# Patient Record
Sex: Male | Born: 1975 | Race: Black or African American | Hispanic: No | Marital: Single | Smoking: Former smoker
Health system: Southern US, Community
[De-identification: ages and names within clinical notes are randomized; demographics above are authoritative.]

## PROBLEM LIST (undated history)

## (undated) DIAGNOSIS — I10 Essential (primary) hypertension: Secondary | ICD-10-CM

## (undated) HISTORY — DX: Essential (primary) hypertension: I10

---

## 2016-01-16 ENCOUNTER — Emergency Department
Admission: EM | Admit: 2016-01-16 | Discharge: 2016-01-16 | Disposition: A | Discharge status: Home / Self Care | Payer: Self-pay | Attending: Emergency Medicine | Admitting: Emergency Medicine

## 2016-01-16 ENCOUNTER — Emergency Department: Payer: Self-pay

## 2016-01-16 ENCOUNTER — Encounter: Payer: Self-pay | Admitting: Emergency Medicine

## 2016-01-16 DIAGNOSIS — M25561 Pain in right knee: Secondary | ICD-10-CM

## 2016-01-16 DIAGNOSIS — M25461 Effusion, right knee: Secondary | ICD-10-CM | POA: Insufficient documentation

## 2016-01-16 DIAGNOSIS — Z87891 Personal history of nicotine dependence: Secondary | ICD-10-CM | POA: Insufficient documentation

## 2016-01-16 LAB — SYNOVIAL CELL COUNT + DIFF, W/ CRYSTALS
Crystals, Fluid: NONE SEEN
EOSINOPHILS-SYNOVIAL: 0 %
Lymphocytes-Synovial Fld: 13 %
MONOCYTE-MACROPHAGE-SYNOVIAL FLUID: 86 %
Neutrophil, Synovial: 1 %
Other Cells-SYN: 0
WBC, Synovial: 266 /mm3 — ABNORMAL HIGH (ref 0–200)

## 2016-01-16 MED ORDER — TRAMADOL HCL 50 MG PO TABS: 50.0000 mg | ORAL_TABLET | Freq: Four times a day (QID) | ORAL | 0 refills | Status: DC | PRN

## 2016-01-16 MED ORDER — OXYCODONE-ACETAMINOPHEN 5-325 MG PO TABS
1.0000 | ORAL_TABLET | Freq: Once | ORAL | Status: AC
Start: 2016-01-16 — End: 2016-01-16
  Administered 2016-01-16: 1 via ORAL
  Filled 2016-01-16: qty 1

## 2016-01-16 MED ORDER — NAPROXEN 500 MG PO TABS: 500.0000 mg | ORAL_TABLET | Freq: Two times a day (BID) | ORAL | 0 refills | Status: DC

## 2016-01-16 MED ORDER — LIDOCAINE HCL (PF) 1 % IJ SOLN
2.0000 mL | Freq: Once | INTRAMUSCULAR | Status: AC
  Administered 2016-01-16: 2 mL
  Filled 2016-01-16: qty 5

## 2016-01-16 NOTE — ED Provider Notes (Signed)
[facility]-EMERGENCY DEPARTMENT Provider Note   CSN: [id number] Arrival date & time: 01/16/16  1633     History   Chief Complaint Chief Complaint  Patient presents with  . Knee Pain    HPI Robert Vincent is a 40 y.o. male presents to the emergency department for evaluation of right knee pain. Patient states his pain began 1 day ago. He denies any trauma or injury. He describes tightness swelling and warmth throughout the right knee. No history of gout. Patient has difficulty with ambulation due to tightness and pain and pressure throughout the knee. Pain is mostly along the superior patellar region. He denies any calf pain. No fevers or radicular pain. He has not had any recent surgeries, long flights or car rides. No chest pain or shortness of breath. He is not taking any medication for pain pain is currently 10 out of 10.  HPI  History reviewed. No pertinent past medical history.  There are no active problems to display for this patient.   History reviewed. No pertinent surgical history.     Home Medications    Prior to Admission medications   Medication Sig Start Date End Date Taking? Authorizing Provider  naproxen (NAPROSYN) 500 MG tablet Take 1 tablet (500 mg total) by mouth 2 (two) times daily with a meal. 01/16/16   Evon Slack, PA-C  traMADol (ULTRAM) 50 MG tablet Take 1 tablet (50 mg total) by mouth every 6 (six) hours as needed. 01/16/16   Evon Slack, PA-C    Family History No family history on file.  Social History Social History  Substance Use Topics  . Smoking status: Former Games developer  . Smokeless tobacco: Not on file  . Alcohol use Not on file     Allergies   Patient has no known allergies.   Review of Systems Review of Systems  Constitutional: Negative.  Negative for activity change, appetite change, chills and fever.  HENT: Negative for congestion, ear pain, mouth sores, rhinorrhea, sinus pressure, sore throat and trouble swallowing.   Eyes:  Negative for photophobia, pain and discharge.  Respiratory: Negative for cough, chest tightness and shortness of breath.   Cardiovascular: Negative for chest pain and leg swelling.  Gastrointestinal: Negative for abdominal distention, abdominal pain, diarrhea, nausea and vomiting.  Genitourinary: Negative for difficulty urinating and dysuria.  Musculoskeletal: Positive for arthralgias and joint swelling. Negative for back pain and gait problem.  Skin: Negative for color change and rash.  Neurological: Negative for dizziness and headaches.  Hematological: Negative for adenopathy.  Psychiatric/Behavioral: Negative for agitation and behavioral problems.     Physical Exam Updated Vital Signs BP 127/68   Pulse 90   Temp 98.6 F (37 C) (Oral)   Resp 18   Ht 5\' 8"  (1.727 m)   Wt 104.3 kg   SpO2 98%   BMI 34.97 kg/m   Physical Exam  Constitutional: He appears well-developed and well-nourished.  HENT:  Head: Normocephalic and atraumatic.  Eyes: Conjunctivae are normal.  Neck: Neck supple.  Cardiovascular: Normal rate and regular rhythm.   No murmur heard. Pulmonary/Chest: Effort normal and breath sounds normal. No respiratory distress. He has no wheezes. He has no rales.  Musculoskeletal:  Examination of the right lower sternum shows patient has negative logroll test area no pain with right hip internal or external rotation. He has limited flexion of the knee, 0-70. There is a moderate effusion. He is able to straight leg raise. There is swelling and warmth throughout the right  knee. No signs of cellulitis or induration indicating soft tissue infection. Patient is nontender throughout the calf and has no pain with ankle plantarflexion and dorsiflexion.  Neurological: He is alert.  Skin: Skin is warm and dry.  Psychiatric: He has a normal mood and affect.  Nursing note and vitals reviewed.    ED Treatments / Results  Labs (all labs ordered are listed, but only abnormal results  are displayed) Labs Reviewed  SYNOVIAL CELL COUNT + DIFF, W/ CRYSTALS - Abnormal; Notable for the following:       Result Value   Color, Synovial STRAW (*)    Appearance-Synovial HAZY (*)    WBC, Synovial 266 (*)    All other components within normal limits  BODY FLUID CULTURE    EKG  EKG Interpretation None       Radiology Dg Knee Complete 4 Views Right  Result Date: 01/16/2016 CLINICAL DATA:  Right knee pain and swelling.  No reported injury. EXAM: RIGHT KNEE - COMPLETE 4+ VIEW COMPARISON:  None. FINDINGS: No fracture, dislocation, joint effusion or suspicious focal osseous lesion. Small superior right patellar enthesophyte. No significant degenerative or erosive arthropathy. No pathologic soft tissue calcifications. IMPRESSION: 1. No fracture, joint effusion or malalignment. 2. Small superior right patellar enthesophyte. Electronically Signed   By: Delbert PhenixJason A Poff M.D.   On: 01/16/2016 18:14    Procedures Procedures (including critical care time) Right knee aspiration: Right knee was prepped with chlorhexidine and Betadine and the superior lateral patellar position. Knee was injected with lidocaine 1%, 3 cc. Knee was then cleansed again with chlorhexidine and 18-gauge needle with 60 cc syringe was injected into the knee joint and 20 cc of normal synovial fluid was aspirated from the knee. Band-Aid was applied, patient tolerated procedure well.  SPLINT APPLICATION Date/Time: 8:24 PM Authorized by: Patience MuscaGAINES, Kourosh Jablonsky CHRISTOPHER Consent: Verbal consent obtained. Risks and benefits: risks, benefits and alternatives were discussed Consent given by: patient Splint applied by: ED tech Location details: Right knee  Splint type: Ace wrap  Supplies used: Ace wrap and crutches  Post-procedure: The splinted body part was neurovascularly unchanged following the procedure. Patient tolerance: Patient tolerated the procedure well with no immediate complications.     Medications Ordered in  ED Medications  lidocaine (PF) (XYLOCAINE) 1 % injection 2 mL (2 mLs Infiltration Given 01/16/16 1730)  oxyCODONE-acetaminophen (PERCOCET/ROXICET) 5-325 MG per tablet 1 tablet (1 tablet Oral Given 01/16/16 1756)     Initial Impression / Assessment and Plan / ED Course  I have reviewed the triage vital signs and the nursing notes.  Pertinent labs & imaging results that were available during my care of the patient were reviewed by me and considered in my medical decision making (see chart for details).  Clinical Course     40 year old male with right knee effusion. No trauma or injury. Concern for possible gout, aspiration of the knee produced 20 cc of normal synovial fluid, culture and cell count and crystals were all negative for any type of gout, CPPD, septic joint. X-ray showed no significant arthropathy, acute bony abnormality. Ace wrap was applied, patient is given crutches. He will take anti-inflammatory medications and follow-up with orthopedics in 5-7 days if no improvement. Return to the ER for any worsening symptoms urgent changes in his health.  Final Clinical Impressions(s) / ED Diagnoses   Final diagnoses:  Acute pain of right knee  Effusion of right knee    New Prescriptions New Prescriptions   NAPROXEN (NAPROSYN) 500 MG  TABLET    Take 1 tablet (500 mg total) by mouth 2 (two) times daily with a meal.   TRAMADOL (ULTRAM) 50 MG TABLET    Take 1 tablet (50 mg total) by mouth every 6 (six) hours as needed.     Jeanna Giuffre C GainEvon Slackes, PA-C 01/16/16 2025    Emily FilbertJonathan E Williams, MD 01/16/16 2114

## 2016-01-16 NOTE — ED Triage Notes (Signed)
R knee pain began yesterday. Denies injury. Limp noted.

## 2016-01-16 NOTE — Discharge Instructions (Signed)
History medications as prescribed. Rest ice and elevate the knee. Use crutches as needed for ambulation. Follow-up with orthopedics if no improvement 5-7 days. Return to the ER for any increasing pain, swelling, warmth redness or fevers.

## 2016-01-20 LAB — BODY FLUID CULTURE
Culture: NO GROWTH
SPECIAL REQUESTS: NORMAL

## 2018-05-16 ENCOUNTER — Other Ambulatory Visit: Payer: Self-pay

## 2018-05-16 ENCOUNTER — Emergency Department: Payer: Self-pay

## 2018-05-16 ENCOUNTER — Emergency Department
Admission: EM | Admit: 2018-05-16 | Discharge: 2018-05-16 | Disposition: A | Discharge status: Home / Self Care | Payer: Self-pay | Attending: Emergency Medicine | Admitting: Emergency Medicine

## 2018-05-16 DIAGNOSIS — Z87891 Personal history of nicotine dependence: Secondary | ICD-10-CM | POA: Insufficient documentation

## 2018-05-16 DIAGNOSIS — J189 Pneumonia, unspecified organism: Secondary | ICD-10-CM | POA: Insufficient documentation

## 2018-05-16 MED ORDER — ACETAMINOPHEN 500 MG PO TABS
1000.0000 mg | ORAL_TABLET | Freq: Once | ORAL | Status: AC
Start: 2018-05-16 — End: 2018-05-16
  Administered 2018-05-16: 1000 mg via ORAL
  Filled 2018-05-16: qty 2

## 2018-05-16 MED ORDER — AZITHROMYCIN 500 MG PO TABS
500.0000 mg | ORAL_TABLET | Freq: Once | ORAL | Status: AC
  Administered 2018-05-16: 500 mg via ORAL
  Filled 2018-05-16: qty 1

## 2018-05-16 MED ORDER — AZITHROMYCIN 250 MG PO TABS: ORAL_TABLET | ORAL | 0 refills | Status: AC

## 2018-05-16 NOTE — ED Triage Notes (Signed)
Pt states fever of 104 since this am with vomiting and headache. Pt denies sore throat, nasal congestion. Pt denies cough or shob. Pt states has taken no antipyretics.

## 2018-05-16 NOTE — Discharge Instructions (Signed)
Take the antibiotic as prescribed starting tomorrow (the first dose was given in the ER).  You can take Tylenol 500 mg up to every 4 hours, or ibuprofen 600 mg up to every 6 hours for fever or pain.  You can also alternate the 2.  Your chest x-ray is showing a pneumonia in the left lung.  This is most likely due to bacteria.  Since at this time we cannot fully rule out coronavirus, you must take the precautions below.  Return to the ER for new or worsening shortness of breath, worsening headache, persistent high fevers, vomiting, weakness, or any other new or worsening symptoms that concern you.     Person Under Monitoring Name: Robert FrameMichael Vincent  Location: [address] [place] [id number]   Infection Prevention Recommendations for Individuals Confirmed to have, or Being Evaluated for, 2019 Novel Coronavirus (COVID-19) Infection Who Receive Care at Home  Individuals who are confirmed to have, or are being evaluated for, COVID-19 should follow the prevention steps below until a healthcare provider or local or state health department says they can return to normal activities.  Stay home except to get medical care You should restrict activities outside your home, except for getting medical care. Do not go to work, school, or public areas, and do not use public transportation or taxis.  Call ahead before visiting your doctor Before your medical appointment, call the healthcare provider and tell them that you have, or are being evaluated for, COVID-19 infection. This will help the healthcare providers office take steps to keep other people from getting infected. Ask your healthcare provider to call the local or state health department.  Monitor your symptoms Seek prompt medical attention if your illness is worsening (e.g., difficulty breathing). Before going to your medical appointment, call the healthcare provider and tell them that you have, or are being evaluated for, COVID-19  infection. Ask your healthcare provider to call the local or state health department.  Wear a facemask You should wear a facemask that covers your nose and mouth when you are in the same room with other people and when you visit a healthcare provider. People who live with or visit you should also wear a facemask while they are in the same room with you.  Separate yourself from other people in your home As much as possible, you should stay in a different room from other people in your home. Also, you should use a separate bathroom, if available.  Avoid sharing household items You should not share dishes, drinking glasses, cups, eating utensils, towels, bedding, or other items with other people in your home. After using these items, you should wash them thoroughly with soap and water.  Cover your coughs and sneezes Cover your mouth and nose with a tissue when you cough or sneeze, or you can cough or sneeze into your sleeve. Throw used tissues in a lined trash can, and immediately wash your hands with soap and water for at least 20 seconds or use an alcohol-based hand rub.  Wash your Union Pacific Corporationhands Wash your hands often and thoroughly with soap and water for at least 20 seconds. You can use an alcohol-based hand sanitizer if soap and water are not available and if your hands are not visibly dirty. Avoid touching your eyes, nose, and mouth with unwashed hands.   Prevention Steps for Caregivers and Household Members of Individuals Confirmed to have, or Being Evaluated for, COVID-19 Infection Being Cared for in the Home  If you live  with, or provide care at home for, a person confirmed to have, or being evaluated for, COVID-19 infection please follow these guidelines to prevent infection:  Follow healthcare providers instructions Make sure that you understand and can help the patient follow any healthcare provider instructions for all care.  Provide for the patients basic needs You should  help the patient with basic needs in the home and provide support for getting groceries, prescriptions, and other personal needs.  Monitor the patients symptoms If they are getting sicker, call his or her medical provider and tell them that the patient has, or is being evaluated for, COVID-19 infection. This will help the healthcare providers office take steps to keep other people from getting infected. Ask the healthcare provider to call the local or state health department.  Limit the number of people who have contact with the patient If possible, have only one caregiver for the patient. Other household members should stay in another home or place of residence. If this is not possible, they should stay in another room, or be separated from the patient as much as possible. Use a separate bathroom, if available. Restrict visitors who do not have an essential need to be in the home.  Keep older adults, very young children, and other sick people away from the patient Keep older adults, very young children, and those who have compromised immune systems or chronic health conditions away from the patient. This includes people with chronic heart, lung, or kidney conditions, diabetes, and cancer.  Ensure good ventilation Make sure that shared spaces in the home have good air flow, such as from an air conditioner or an opened window, weather permitting.  Wash your hands often Wash your hands often and thoroughly with soap and water for at least 20 seconds. You can use an alcohol based hand sanitizer if soap and water are not available and if your hands are not visibly dirty. Avoid touching your eyes, nose, and mouth with unwashed hands. Use disposable paper towels to dry your hands. If not available, use dedicated cloth towels and replace them when they become wet.  Wear a facemask and gloves Wear a disposable facemask at all times in the room and gloves when you touch or have contact with the  patients blood, body fluids, and/or secretions or excretions, such as sweat, saliva, sputum, nasal mucus, vomit, urine, or feces.  Ensure the mask fits over your nose and mouth tightly, and do not touch it during use. Throw out disposable facemasks and gloves after using them. Do not reuse. Wash your hands immediately after removing your facemask and gloves. If your personal clothing becomes contaminated, carefully remove clothing and launder. Wash your hands after handling contaminated clothing. Place all used disposable facemasks, gloves, and other waste in a lined container before disposing them with other household waste. Remove gloves and wash your hands immediately after handling these items.  Do not share dishes, glasses, or other household items with the patient Avoid sharing household items. You should not share dishes, drinking glasses, cups, eating utensils, towels, bedding, or other items with a patient who is confirmed to have, or being evaluated for, COVID-19 infection. After the person uses these items, you should wash them thoroughly with soap and water.  Wash laundry thoroughly Immediately remove and wash clothes or bedding that have blood, body fluids, and/or secretions or excretions, such as sweat, saliva, sputum, nasal mucus, vomit, urine, or feces, on them. Wear gloves when handling laundry from the patient. Read  and follow directions on labels of laundry or clothing items and detergent. In general, wash and dry with the warmest temperatures recommended on the label.  Clean all areas the individual has used often Clean all touchable surfaces, such as counters, tabletops, doorknobs, bathroom fixtures, toilets, phones, keyboards, tablets, and bedside tables, every day. Also, clean any surfaces that may have blood, body fluids, and/or secretions or excretions on them. Wear gloves when cleaning surfaces the patient has come in contact with. Use a diluted bleach solution (e.g.,  dilute bleach with 1 part bleach and 10 parts water) or a household disinfectant with a label that says EPA-registered for coronaviruses. To make a bleach solution at home, add 1 tablespoon of bleach to 1 quart (4 cups) of water. For a larger supply, add  cup of bleach to 1 gallon (16 cups) of water. Read labels of cleaning products and follow recommendations provided on product labels. Labels contain instructions for safe and effective use of the cleaning product including precautions you should take when applying the product, such as wearing gloves or eye protection and making sure you have good ventilation during use of the product. Remove gloves and wash hands immediately after cleaning.  Monitor yourself for signs and symptoms of illness Caregivers and household members are considered close contacts, should monitor their health, and will be asked to limit movement outside of the home to the extent possible. Follow the monitoring steps for close contacts listed on the symptom monitoring form.   ? If you have additional questions, contact your local health department or call the epidemiologist on call at [phone] (available 24/7). ? This guidance is subject to change. For the most up-to-date guidance from [facility], please refer to their website: [employer]hu

## 2018-05-16 NOTE — ED Notes (Signed)
Pt alert and oriented X4, active, cooperative, pt in NAD. RR even and unlabored, color WNL.  Pt informed to return if any life threatening symptoms occur.  Discharge and followup instructions reviewed. Ambulates safely. Left with all of belongings. 

## 2018-05-16 NOTE — ED Provider Notes (Signed)
[facility] Emergency Department Provider Note ____________________________________________   First MD Initiated Contact with Patient 05/16/18 1155     (approximate)  I have reviewed the triage vital signs and the nursing notes.   HISTORY  Chief Complaint Fever    HPI Robert Vincent is a 43 y.o. male with no significant PMH who presents with fever, acute onset this morning, measured to 104 at home, and associated with a frontal headache and one episode of vomiting.  Patient states that he also has some malaise.  He states that he tried to take some over-the-counter medication on an empty stomach and that is when he vomited.  The patient denies any neck stiffness, back or neck pain, rash, shortness of breath, or cough, although he did begin to cough while I was in the room with him.  He has no sick contacts or any travel outside of the state.  No past medical history on file.  There are no active problems to display for this patient.   No past surgical history on file.  Prior to Admission medications   Medication Sig Start Date End Date Taking? Authorizing Provider  azithromycin (ZITHROMAX Z-PAK) 250 MG tablet Take 2 tablets (500 mg) on  Day 1,  followed by 1 tablet (250 mg) once daily on Days 2 through 5.  (Start the day after the ER visit) 05/16/18 05/21/18  Dionne BucySiadecki, Johnta Couts, MD  naproxen (NAPROSYN) 500 MG tablet Take 1 tablet (500 mg total) by mouth 2 (two) times daily with a meal. 01/16/16   Evon SlackGaines, Thomas C, PA-C  traMADol (ULTRAM) 50 MG tablet Take 1 tablet (50 mg total) by mouth every 6 (six) hours as needed. 01/16/16   Evon SlackGaines, Thomas C, PA-C    Allergies Patient has no known allergies.  No family history on file.  Social History Social History   Tobacco Use   Smoking status: Former Smoker  Substance Use Topics   Alcohol use: Not on file   Drug use: Not on file    Review of Systems  Constitutional: Positive for fever. Eyes: No  photophobia. ENT: No sore throat.  No nasal congestion. Cardiovascular: Denies chest pain. Respiratory: Denies shortness of breath. Gastrointestinal: Positive for resolved vomiting.  No diarrhea.  Genitourinary: Negative for dysuria.  Musculoskeletal: Negative for back pain. Skin: Negative for rash. Neurological: Positive for headache.   ____________________________________________   PHYSICAL EXAM:  VITAL SIGNS: ED Triage Vitals [05/16/18 1123]  Enc Vitals Group     BP (!) 133/94     Pulse Rate (!) 116     Resp 18     Temp (!) 103.4 F (39.7 C)     Temp Source Oral     SpO2 97 %     Weight 230 lb (104.3 kg)     Height 5\' 8"  (1.727 m)     Head Circumference      Peak Flow      Pain Score 8     Pain Loc      Pain Edu?      Excl. in [place]?     Constitutional: Alert and oriented. Well appearing and in no acute distress. Eyes: Conjunctivae are normal.  EOMI.  PERRLA.  No photophobia. Head: Atraumatic. Nose: No congestion/rhinnorhea. Mouth/Throat: Mucous membranes are moist.   Neck: Supple.  Normal range of motion.  No meningeal signs. Cardiovascular: Normal rate, regular rhythm. Grossly normal heart sounds.  Good peripheral circulation. Respiratory: Normal respiratory effort.  No retractions. Lungs CTAB.  Gastrointestinal: No distention.  Musculoskeletal: Extremities warm and well perfused.  Neurologic:  Normal speech and language.  Motor intact in all extremities.  Normal coordination.  No gross focal neurologic deficits are appreciated.  Skin:  Skin is warm and dry. No rash noted. Psychiatric: Mood and affect are normal. Speech and behavior are normal.  ____________________________________________   LABS (all labs ordered are listed, but only abnormal results are displayed)  Labs Reviewed - No data to display ____________________________________________  EKG   ____________________________________________  RADIOLOGY  CXR: Left midlung opacity consistent with  pneumonia  ____________________________________________   PROCEDURES  Procedure(s) performed: No  Procedures  Critical Care performed: No ____________________________________________   INITIAL IMPRESSION / ASSESSMENT AND PLAN / ED COURSE  Pertinent labs & imaging results that were available during my care of the patient were reviewed by me and considered in my medical decision making (see chart for details).  43 year old male with no significant past medical history not currently on any medications presents with acute onset of fever this morning with a headache, one episode of vomiting after he tried to take some medication, and some fatigue but no other significant symptoms.  He did develop a mild cough once he came to the hospital.  On exam he is well-appearing.  He has a fever and mild concomitant tachycardia but otherwise normal vital signs.  The patient has a nonfocal neuro exam, no photophobia, neck stiffness, or any meningeal signs.  The remainder of the exam is unremarkable.  Chest x-ray obtained after triage reveals left midlung opacity consistent with pneumonia.  Overall, I suspect that the patient is having pneumonia and developed the fever prior to having a lot of other symptoms.  The patient's headache is frontal and there are no concerning findings such as photophobia or meningeal signs; it is consistent with headache related to acute fever.  There is no clinical evidence to support meningitis, especially given the findings on the x-ray.  I discussed the possibility of doing a lumbar puncture with the patient although he agrees that with a low suspicion for meningitis he would prefer not to do this.  Differential also includes COVID-19 although this would be less likely to cause a lobar infiltrate.  At this time, the patient does not meet testing criteria.  We will give the patient antipyretic.  He is well-appearing and has no hypoxia so anticipate most likely discharge  home, and I will prescribe antibiotics.  We will give self-isolation instructions for COVID-19.  ----------------------------------------- 1:40 PM on 05/16/2018 -----------------------------------------  The patient is feeling a lot better after Tylenol and his temperature has improved.  Heart rate is now around 100.  He continues to appear comfortable, and is stable for discharge home.  I counseled him on the results of the work-up and the plan of care as well as COVID-19 isolation instructions.  We gave a dose of azithromycin here and I have prescribed the same for home.  Return precautions given, and he expresses understanding.  ________  Robert Frame was evaluated in Emergency Department on 05/16/2018 for the symptoms described in the history of present illness. He was evaluated in the context of the global COVID-19 pandemic, which necessitated consideration that the patient might be at risk for infection with the SARS-CoV-2 virus that causes COVID-19. Institutional protocols and algorithms that pertain to the evaluation of patients at risk for COVID-19 are in a state of rapid change based on information released by regulatory bodies including the CDC and federal and state  organizations. These policies and algorithms were followed during the patient's care in the ED.  ____________________________________________   FINAL CLINICAL IMPRESSION(S) / ED DIAGNOSES  Final diagnoses:  Community acquired pneumonia of left lung, unspecified part of lung      NEW MEDICATIONS STARTED DURING THIS VISIT:  New Prescriptions   AZITHROMYCIN (ZITHROMAX Z-PAK) 250 MG TABLET    Take 2 tablets (500 mg) on  Day 1,  followed by 1 tablet (250 mg) once daily on Days 2 through 5.  (Start the day after the ER visit)     Note:  This document was prepared using Dragon voice recognition software and may include unintentional dictation errors.    Dionne Bucy, MD 05/16/18 1340

## 2019-01-27 ENCOUNTER — Other Ambulatory Visit: Payer: Self-pay

## 2019-01-27 ENCOUNTER — Emergency Department
Admission: EM | Admit: 2019-01-27 | Discharge: 2019-01-27 | Disposition: A | Discharge status: Home / Self Care | Payer: Self-pay | Attending: Emergency Medicine | Admitting: Emergency Medicine

## 2019-01-27 ENCOUNTER — Encounter: Payer: Self-pay | Admitting: Emergency Medicine

## 2019-01-27 DIAGNOSIS — Z87891 Personal history of nicotine dependence: Secondary | ICD-10-CM | POA: Insufficient documentation

## 2019-01-27 DIAGNOSIS — M1000 Idiopathic gout, unspecified site: Secondary | ICD-10-CM

## 2019-01-27 DIAGNOSIS — M10072 Idiopathic gout, left ankle and foot: Secondary | ICD-10-CM | POA: Insufficient documentation

## 2019-01-27 LAB — CBC WITH DIFFERENTIAL/PLATELET
Abs Immature Granulocytes: 0.01 10*3/uL (ref 0.00–0.07)
Basophils Absolute: 0.1 10*3/uL (ref 0.0–0.1)
Basophils Relative: 1 %
Eosinophils Absolute: 0.2 10*3/uL (ref 0.0–0.5)
Eosinophils Relative: 5 %
HCT: 40.7 % (ref 39.0–52.0)
Hemoglobin: 13.7 g/dL (ref 13.0–17.0)
Immature Granulocytes: 0 %
Lymphocytes Relative: 28 %
Lymphs Abs: 1.5 10*3/uL (ref 0.7–4.0)
MCH: 28.9 pg (ref 26.0–34.0)
MCHC: 33.7 g/dL (ref 30.0–36.0)
MCV: 85.9 fL (ref 80.0–100.0)
Monocytes Absolute: 0.4 10*3/uL (ref 0.1–1.0)
Monocytes Relative: 8 %
Neutro Abs: 3.1 10*3/uL (ref 1.7–7.7)
Neutrophils Relative %: 58 %
Platelets: 270 10*3/uL (ref 150–400)
RBC: 4.74 MIL/uL (ref 4.22–5.81)
RDW: 13.7 % (ref 11.5–15.5)
WBC: 5.3 10*3/uL (ref 4.0–10.5)
nRBC: 0 % (ref 0.0–0.2)

## 2019-01-27 LAB — URIC ACID: Uric Acid, Serum: 9.6 mg/dL — ABNORMAL HIGH (ref 3.7–8.6)

## 2019-01-27 MED ORDER — NAPROXEN 500 MG PO TABS: 500.0000 mg | ORAL_TABLET | Freq: Two times a day (BID) | ORAL | 2 refills | Status: AC

## 2019-01-27 MED ORDER — HYDROCODONE-ACETAMINOPHEN 5-325 MG PO TABS
1.0000 | ORAL_TABLET | Freq: Once | ORAL | Status: AC
  Administered 2019-01-27: 1 via ORAL
  Filled 2019-01-27: qty 1

## 2019-01-27 MED ORDER — HYDROCODONE-ACETAMINOPHEN 5-325 MG PO TABS: 1.0000 | ORAL_TABLET | Freq: Four times a day (QID) | ORAL | 0 refills | Status: DC | PRN

## 2019-01-27 MED ORDER — COLCHICINE 0.6 MG PO TABS: 0.6000 mg | ORAL_TABLET | Freq: Every day | ORAL | 2 refills | Status: AC

## 2019-01-27 NOTE — Discharge Instructions (Addendum)
Follow-up with your regular doctor if not better in 3 days.  Return emergency department worsening.  Take medication as prescribed.  Try to follow a low purine diet.  The purines in meats and proteins actually cause gout.  Avoid alcohol intake.  This also affects gout

## 2019-01-27 NOTE — ED Provider Notes (Signed)
[facility] Emergency Department Provider Note  ____________________________________________   First MD Initiated Contact with Patient 01/27/19 1236     (approximate)  I have reviewed the triage vital signs and the nursing notes.   HISTORY  Chief Complaint Ankle Pain    HPI Robert Vincent is a 43 y.o. male presents emergency department complaining of left ankle pain.  Patient states that his family members have said he might have gout.  States the pain has been intermittent.  He states it will come and go over the past 2 months.  Increased recently and where it hurts for the sheets touch his skin.  He denies any fever chills.  No known injury.    History reviewed. No pertinent past medical history.  There are no problems to display for this patient.   History reviewed. No pertinent surgical history.  Prior to Admission medications   Medication Sig Start Date End Date Taking? Authorizing Provider  colchicine 0.6 MG tablet Take 1 tablet (0.6 mg total) by mouth daily. 01/27/19 01/27/20  Ranay Ketter, Linden Dolin, PA-C  HYDROcodone-acetaminophen (NORCO/VICODIN) 5-325 MG tablet Take 1 tablet by mouth every 6 (six) hours as needed for moderate pain. 01/27/19   Versie Starks, PA-C  naproxen (NAPROSYN) 500 MG tablet Take 1 tablet (500 mg total) by mouth 2 (two) times daily with a meal. 01/27/19 01/27/20  Caryn Section Linden Dolin, PA-C    Allergies Patient has no known allergies.  No family history on file.  Social History Social History   Tobacco Use  . Smoking status: Former Smoker  Substance Use Topics  . Alcohol use: Not on file  . Drug use: Not on file    Review of Systems  Constitutional: No fever/chills Eyes: No visual changes. ENT: No sore throat. Respiratory: Denies cough Genitourinary: Negative for dysuria. Musculoskeletal: Negative for back pain.  Positive for left ankle pain Skin: Negative for  rash.    ____________________________________________   PHYSICAL EXAM:  VITAL SIGNS: ED Triage Vitals  Enc Vitals Group     BP 01/27/19 1201 (!) 151/94     Pulse Rate 01/27/19 1201 (!) 103     Resp 01/27/19 1201 15     Temp 01/27/19 1201 98.8 F (37.1 C)     Temp Source 01/27/19 1201 Oral     SpO2 01/27/19 1201 97 %     Weight 01/27/19 1203 240 lb (108.9 kg)     Height 01/27/19 1203 5\' 8"  (1.727 m)     Head Circumference --      Peak Flow --      Pain Score 01/27/19 1202 10     Pain Loc --      Pain Edu? --      Excl. in [place]? --     Constitutional: Alert and oriented. Well appearing and in no acute distress. Eyes: Conjunctivae are normal.  Head: Atraumatic. Nose: No congestion/rhinnorhea. Mouth/Throat: Mucous membranes are moist.   Neck:  supple no lymphadenopathy noted Cardiovascular: Normal rate, regular rhythm. Respiratory: Normal respiratory effort.  No retractions,  GU: deferred Musculoskeletal: FROM all extremities, warm and well perfused, left ankle and top of the left foot are both tender, increased warmth and some redness noted.  Neurovascular is intact Neurologic:  Normal speech and language.  Skin:  Skin is warm, dry and intact. No rash noted. Psychiatric: Mood and affect are normal. Speech and behavior are normal.  ____________________________________________   LABS (all labs ordered are listed, but only abnormal results  are displayed)  Labs Reviewed  URIC ACID - Abnormal; Notable for the following components:      Result Value   Uric Acid, Serum 9.6 (*)    All other components within normal limits  CBC WITH DIFFERENTIAL/PLATELET   ____________________________________________   ____________________________________________  RADIOLOGY    ____________________________________________   PROCEDURES  Procedure(s) performed: Vicodin 1 p.o., crutches   Procedures    ____________________________________________   INITIAL IMPRESSION /  ASSESSMENT AND PLAN / ED COURSE  Pertinent labs & imaging results that were available during my care of the patient were reviewed by me and considered in my medical decision making (see chart for details).   Patient is a 43 year old male presents emergency department with concerns of gout.  See HPI  Physical exam shows the ankle to be tender with some increased warmth.  CBC is normal, uric acid is elevated  Explained the findings to the patient.  Explained to him that elevated uric acid indicates gout.  We did discuss a low purine diet.  He was given a prescription for colchicine, Naprosyn, and Vicodin.  He is to follow-up with his regular doctor for any additional concerns.  Return emergency department worsening.  States he understands will comply.  Is discharged stable condition    Robert Vincent was evaluated in Emergency Department on 01/27/2019 for the symptoms described in the history of present illness. He was evaluated in the context of the global COVID-19 pandemic, which necessitated consideration that the patient might be at risk for infection with the SARS-CoV-2 virus that causes COVID-19. Institutional protocols and algorithms that pertain to the evaluation of patients at risk for COVID-19 are in a state of rapid change based on information released by regulatory bodies including the CDC and federal and state organizations. These policies and algorithms were followed during the patient's care in the ED.   As part of my medical decision making, I reviewed the following data within the electronic MEDICAL RECORD NUMBER Nursing notes reviewed and incorporated, Labs reviewed CBC normal, uric acid elevated, Old chart reviewed, Notes from prior ED visits and [place] Controlled Substance Database  ____________________________________________   FINAL CLINICAL IMPRESSION(S) / ED DIAGNOSES  Final diagnoses:  Idiopathic gout, unspecified chronicity, unspecified site      NEW MEDICATIONS STARTED  DURING THIS VISIT:  New Prescriptions   COLCHICINE 0.6 MG TABLET    Take 1 tablet (0.6 mg total) by mouth daily.   HYDROCODONE-ACETAMINOPHEN (NORCO/VICODIN) 5-325 MG TABLET    Take 1 tablet by mouth every 6 (six) hours as needed for moderate pain.   NAPROXEN (NAPROSYN) 500 MG TABLET    Take 1 tablet (500 mg total) by mouth 2 (two) times daily with a meal.     Note:  This document was prepared using Dragon voice recognition software and may include unintentional dictation errors.    Faythe Ghee, PA-C 01/27/19 1457    Jene Every, MD 01/27/19 [phone]

## 2019-01-27 NOTE — ED Triage Notes (Signed)
Pt reports right ankle pain since yesterday with no injury. Pt states that he would like it checked out and to maybe be checked for gout because he has this issue intermittently.

## 2019-05-18 ENCOUNTER — Ambulatory Visit: Payer: Self-pay | Attending: Internal Medicine

## 2019-05-18 DIAGNOSIS — Z23 Encounter for immunization: Secondary | ICD-10-CM

## 2019-05-18 NOTE — Progress Notes (Signed)
   Covid-19 Vaccination Clinic  Name:  Robert Vincent    MRN: [id number] DOB: 07-03-1975  05/18/2019  Robert Vincent was observed post Covid-19 immunization for 15 minutes without incident. He was provided with Vaccine Information Sheet and instruction to access the V-Safe system.   Robert Vincent was instructed to call 911 with any severe reactions post vaccine: Marland Kitchen Difficulty breathing  . Swelling of face and throat  . A fast heartbeat  . A bad rash all over body  . Dizziness and weakness   Immunizations Administered    Name Date Dose VIS Date Route   Pfizer COVID-19 Vaccine 05/18/2019  8:36 AM 0.3 mL 01/18/2019 Intramuscular   Manufacturer: [employer], [employer]   Lot: [id number]   NDC: [address]-7

## 2019-06-11 ENCOUNTER — Ambulatory Visit: Payer: Self-pay | Attending: Internal Medicine

## 2019-06-11 DIAGNOSIS — Z23 Encounter for immunization: Secondary | ICD-10-CM

## 2019-06-11 NOTE — Progress Notes (Signed)
   Covid-19 Vaccination Clinic  Name:  Javar Eshbach    MRN: [id number] DOB: 09-27-1975  06/11/2019  Mr. Terpening was observed post Covid-19 immunization for 15 minutes without incident. He was provided with Vaccine Information Sheet and instruction to access the V-Safe system.   Mr. Ruffini was instructed to call 911 with any severe reactions post vaccine: Marland Kitchen Difficulty breathing  . Swelling of face and throat  . A fast heartbeat  . A bad rash all over body  . Dizziness and weakness   Immunizations Administered    Name Date Dose VIS Date Route   Pfizer COVID-19 Vaccine 06/11/2019  3:42 PM 0.3 mL 04/03/2018 Intramuscular   Manufacturer: [employer], [employer]   Lot: [id number]   NDC: [address]-7

## 2019-07-30 ENCOUNTER — Emergency Department: Payer: Self-pay

## 2019-07-30 ENCOUNTER — Other Ambulatory Visit: Payer: Self-pay

## 2019-07-30 ENCOUNTER — Emergency Department
Admission: EM | Admit: 2019-07-30 | Discharge: 2019-07-30 | Disposition: A | Discharge status: Home / Self Care | Payer: Self-pay | Attending: Emergency Medicine | Admitting: Emergency Medicine

## 2019-07-30 DIAGNOSIS — R519 Headache, unspecified: Secondary | ICD-10-CM | POA: Insufficient documentation

## 2019-07-30 DIAGNOSIS — Z87891 Personal history of nicotine dependence: Secondary | ICD-10-CM | POA: Insufficient documentation

## 2019-07-30 DIAGNOSIS — I10 Essential (primary) hypertension: Secondary | ICD-10-CM | POA: Insufficient documentation

## 2019-07-30 LAB — CBC
HCT: 40.1 % (ref 39.0–52.0)
Hemoglobin: 13.5 g/dL (ref 13.0–17.0)
MCH: 28.9 pg (ref 26.0–34.0)
MCHC: 33.7 g/dL (ref 30.0–36.0)
MCV: 85.9 fL (ref 80.0–100.0)
Platelets: 269 10*3/uL (ref 150–400)
RBC: 4.67 MIL/uL (ref 4.22–5.81)
RDW: 13.8 % (ref 11.5–15.5)
WBC: 6.6 10*3/uL (ref 4.0–10.5)
nRBC: 0 % (ref 0.0–0.2)

## 2019-07-30 LAB — BASIC METABOLIC PANEL
Anion gap: 10 (ref 5–15)
BUN: 17 mg/dL (ref 6–20)
CO2: 22 mmol/L (ref 22–32)
Calcium: 8.7 mg/dL — ABNORMAL LOW (ref 8.9–10.3)
Chloride: 102 mmol/L (ref 98–111)
Creatinine, Ser: 1.18 mg/dL (ref 0.61–1.24)
GFR calc Af Amer: >60 mL/min (ref >60)
GFR calc non Af Amer: >60 mL/min (ref >60)
Glucose, Bld: 97 mg/dL (ref 70–99)
Potassium: 3.9 mmol/L (ref 3.5–5.1)
Sodium: 134 mmol/L — ABNORMAL LOW (ref 135–145)

## 2019-07-30 MED ORDER — DIPHENHYDRAMINE HCL 50 MG/ML IJ SOLN
25.0000 mg | Freq: Once | INTRAMUSCULAR | Status: AC
  Administered 2019-07-30: 16:00:00 25 mg via INTRAVENOUS
  Filled 2019-07-30: qty 1

## 2019-07-30 MED ORDER — KETOROLAC TROMETHAMINE 30 MG/ML IJ SOLN
30.0000 mg | Freq: Once | INTRAMUSCULAR | Status: AC
  Administered 2019-07-30: 16:00:00 30 mg via INTRAVENOUS
  Filled 2019-07-30: qty 1

## 2019-07-30 MED ORDER — DEXTROSE 5 % IV SOLN
20.0000 mg | Freq: Once | INTRAVENOUS | Status: AC
Start: 2019-07-30 — End: 2019-07-30
  Administered 2019-07-30: 17:00:00 20 mg via INTRAVENOUS
  Filled 2019-07-30: qty 4

## 2019-07-30 MED ORDER — SODIUM CHLORIDE 0.9 % IV SOLN
1000.0000 mL | Freq: Once | INTRAVENOUS | Status: AC
  Administered 2019-07-30: 16:00:00 1000 mL via INTRAVENOUS

## 2019-07-30 MED ORDER — BUTALBITAL-APAP-CAFFEINE 50-325-40 MG PO TABS: 1.0000 | ORAL_TABLET | Freq: Four times a day (QID) | ORAL | 0 refills | Status: AC | PRN

## 2019-07-30 NOTE — ED Triage Notes (Signed)
Pt c/o having a stabbing HA since yesterday states he has taken Excedrin and b/c powder today with no relief.c/o photophobia. Denies N/V/or visual changes. States he has a hx of HTN but has never been prescribed any thing for it. Pt is hypertensive in triage today

## 2019-07-30 NOTE — ED Notes (Signed)
Pt ambulatory from lobby with steady gait. Speech clear. Pt reports 'I have been getting migraines and the light is bothering me". Reports he has always had headache but pain is worse this time and excedrin is not helping. Pt A&Ox4 and in NAD.

## 2019-07-30 NOTE — ED Provider Notes (Signed)
[facility] Emergency Department Provider Note   ____________________________________________    I have reviewed the triage vital signs and the nursing notes.   HISTORY  Chief Complaint Headache     HPI Robert Vincent is a 44 y.o. male with a history of high blood pressure presents today with complaints of headache.  Patient describes bilateral headache most prominent frontally, denies neuro deficits.  No nausea or vomiting.  Has tried over-the-counter medications without much improvement.  Does have a history of headaches but denies a history of migraines.  No trauma.  No change in vision.  No fevers or chills or neck pain.  Does report photophobia  Past Medical History:  Diagnosis Date  . Hypertension     There are no problems to display for this patient.   History reviewed. No pertinent surgical history.  Prior to Admission medications   Medication Sig Start Date End Date Taking? Authorizing Provider  butalbital-acetaminophen-caffeine (FIORICET) 50-325-40 MG tablet Take 1-2 tablets by mouth every 6 (six) hours as needed for headache. 07/30/19 07/29/20  Lavonia Drafts, MD  colchicine 0.6 MG tablet Take 1 tablet (0.6 mg total) by mouth daily. 01/27/19 01/27/20  Fisher, Linden Dolin, PA-C  HYDROcodone-acetaminophen (NORCO/VICODIN) 5-325 MG tablet Take 1 tablet by mouth every 6 (six) hours as needed for moderate pain. 01/27/19   Versie Starks, PA-C  naproxen (NAPROSYN) 500 MG tablet Take 1 tablet (500 mg total) by mouth 2 (two) times daily with a meal. 01/27/19 01/27/20  Caryn Section Linden Dolin, PA-C     Allergies Patient has no known allergies.  No family history on file.  Social History Social History   Tobacco Use  . Smoking status: Former Research scientist (life sciences)  . Smokeless tobacco: Never Used  Substance Use Topics  . Alcohol use: Yes  . Drug use: Not Currently    Review of Systems  Constitutional: No fever/chills Eyes: No visual changes.  ENT: No sore  throat. Cardiovascular: Denies chest pain. Respiratory: Denies shortness of breath. Gastrointestinal: No abdominal pain.  No nausea, no vomiting.   Genitourinary: Negative for dysuria. Musculoskeletal: Negative for back pain. Skin: Negative for rash. Neurological: Headache as above   ____________________________________________   PHYSICAL EXAM:  VITAL SIGNS: ED Triage Vitals  Enc Vitals Group     BP 07/30/19 1401 (!) 161/109     Pulse Rate 07/30/19 1401 92     Resp 07/30/19 1401 18     Temp 07/30/19 1401 98.8 F (37.1 C)     Temp Source 07/30/19 1401 Oral     SpO2 07/30/19 1401 100 %     Weight 07/30/19 1401 108.9 kg (240 lb)     Height 07/30/19 1401 1.727 m (5\' 8" )     Head Circumference --      Peak Flow --      Pain Score 07/30/19 1406 10     Pain Loc --      Pain Edu? --      Excl. in [place]? --     Constitutional: Alert and oriented. No acute distress.  Eyes: Conjunctivae are normal.  PERRLA, EOMI Head: Atraumatic.  Mouth/Throat: Mucous membranes are moist.   Neck:  Painless ROM Cardiovascular: Normal rate, regular rhythm. Grossly normal heart sounds.  Good peripheral circulation. Respiratory: Normal respiratory effort.  No retractions. Gastrointestinal: Soft and nontender. No distention.   Musculoskeletal: No lower extremity tenderness nor edema.  Warm and well perfused Neurologic:  Normal speech and language. No gross focal neurologic deficits  are appreciated.  Cranial nerves II through XII are normal Skin:  Skin is warm, dry and intact. No rash noted. Psychiatric: Mood and affect are normal. Speech and behavior are normal.  ____________________________________________   LABS (all labs ordered are listed, but only abnormal results are displayed)  Labs Reviewed  BASIC METABOLIC PANEL - Abnormal; Notable for the following components:      Result Value   Sodium 134 (*)    Calcium 8.7 (*)    All other components within normal limits  CBC    ____________________________________________  EKG   ____________________________________________  RADIOLOGY  CT head unremarkable ____________________________________________   PROCEDURES  Procedure(s) performed: No  Procedures   Critical Care performed: No ____________________________________________   INITIAL IMPRESSION / ASSESSMENT AND PLAN / ED COURSE  Pertinent labs & imaging results that were available during my care of the patient were reviewed by me and considered in my medical decision making (see chart for details).  Patient presents with headache as noted above.  Differential includes tension headache, stress related headache, migraine, elevated blood pressure, not consistent with CVA or ICH  We will treat with Toradol, Benadryl, fluids, Reglan obtain imaging and reevaluate  CT imaging is unremarkable  Patient is feeling better after treatments, reports pain has improved significantly.  Appropriate for discharge at this time outpatient follow-up as needed.  Return precautions discussed.    ____________________________________________   FINAL CLINICAL IMPRESSION(S) / ED DIAGNOSES  Final diagnoses:  Acute nonintractable headache, unspecified headache type        Note:  This document was prepared using Dragon voice recognition software and may include unintentional dictation errors.   Jene Every, MD 07/30/19 [phone]

## 2019-07-30 NOTE — ED Notes (Signed)
Pt otf for CT

## 2021-09-20 IMAGING — CT CT HEAD W/O CM
3 series · 15 of 46 positions shown, 18 images · non-contrast
Comparison: None.

CLINICAL DATA: 44-year-old male with headache.

EXAM:
CT HEAD WITHOUT CONTRAST
TECHNIQUE: Contiguous axial images were obtained from the base of the skull
through the vertex without intravenous contrast.

[Series 2: head wo · axial · 0.45mm/px · z∈[-124,-4]mm · 9 of 29 slices shown, 12 images]
[im 3/29  brain]
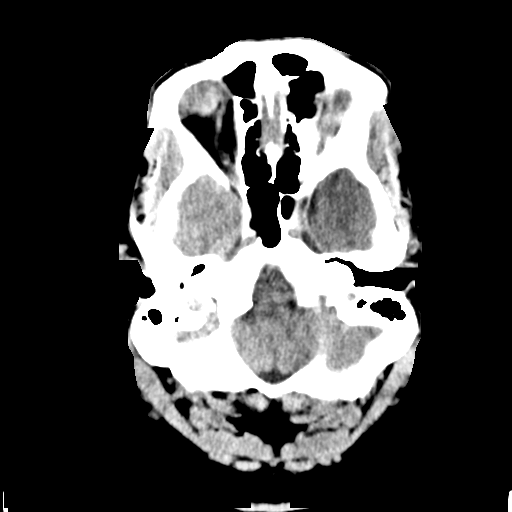
[im 3/29  bone]
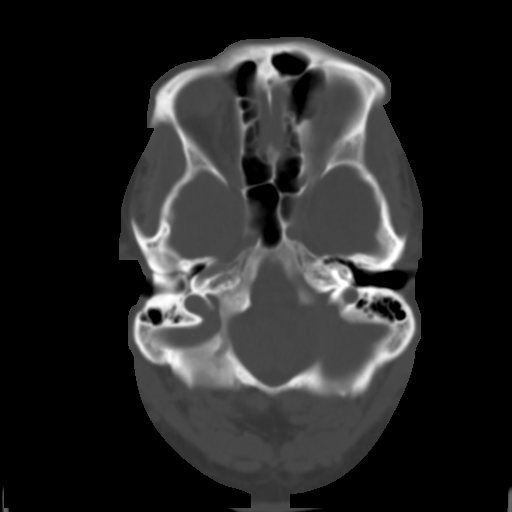
[im 6/29  brain]
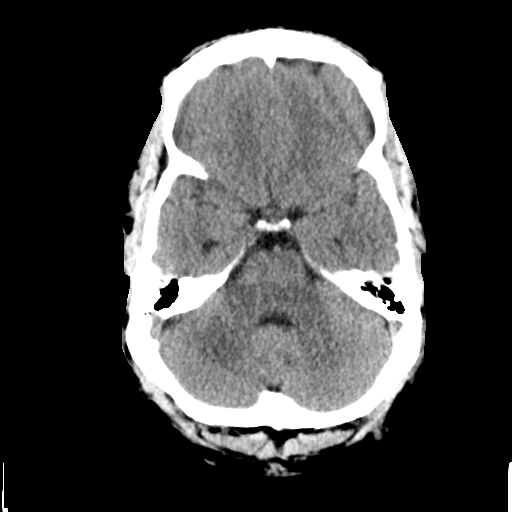
[im 9/29  brain]
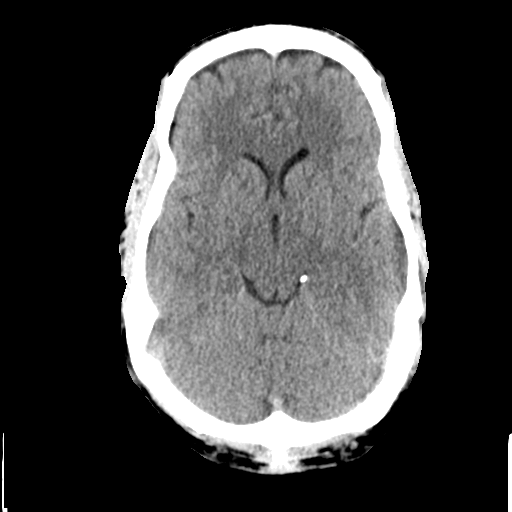
[im 12/29  brain]
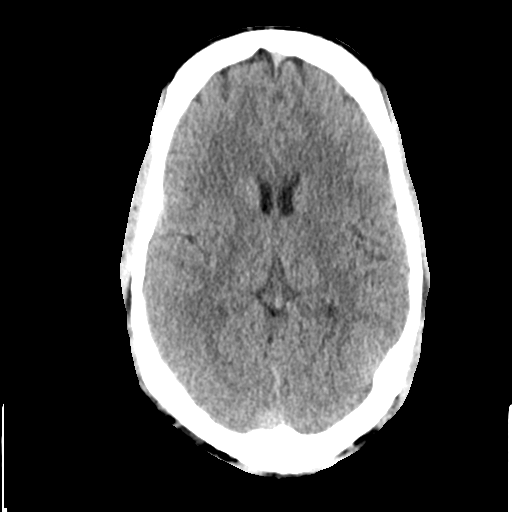
[im 15/29  brain]
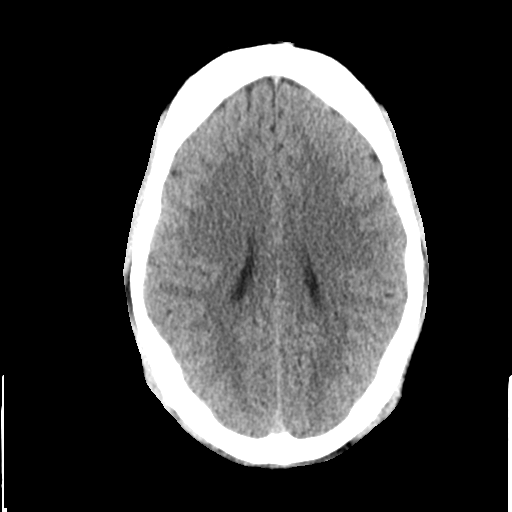
[im 15/29  bone]
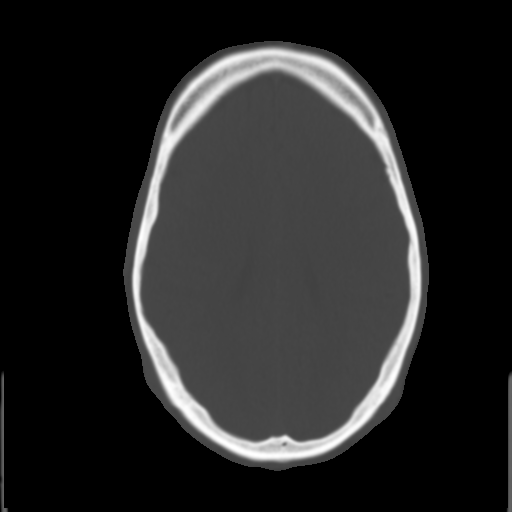
[im 18/29  brain]
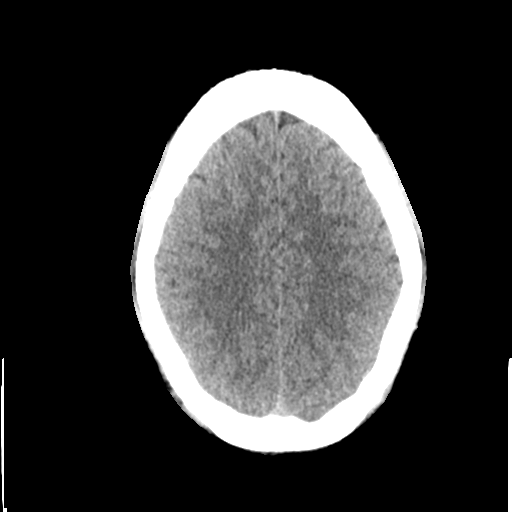
[im 21/29  brain]
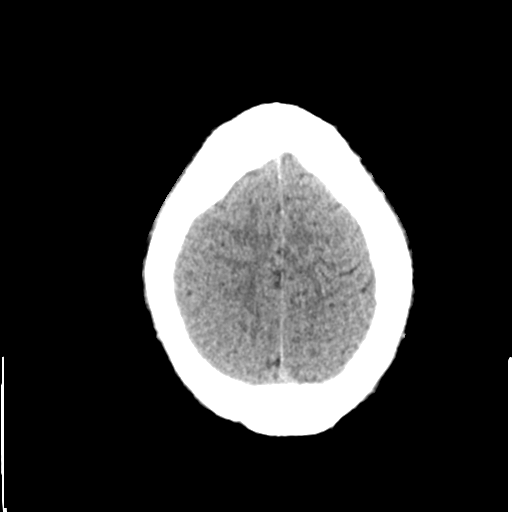
[im 24/29  brain]
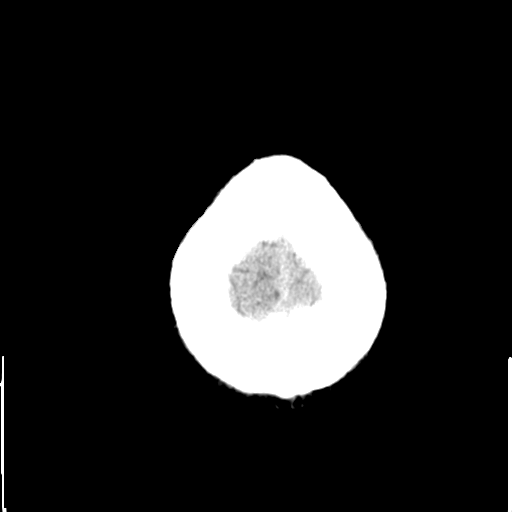
[im 27/29  brain]
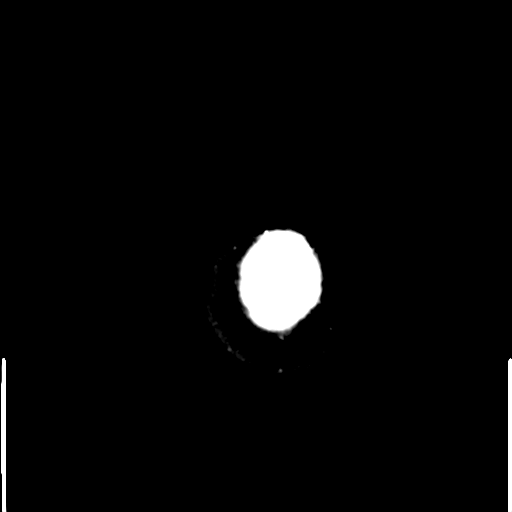
[im 27/29  bone]
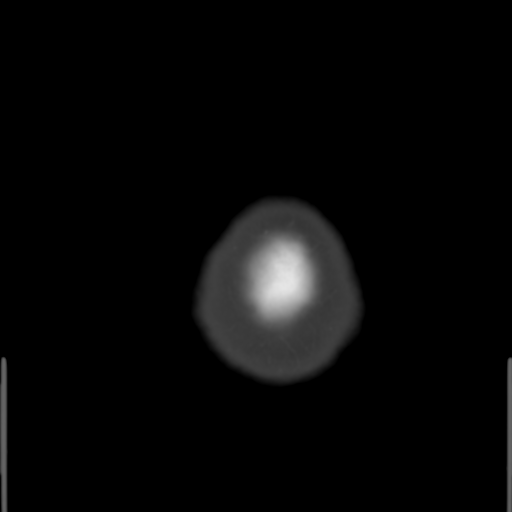

[Series 4: coronal soft tissue · coronal · 0.31mm/px · 3 of 70 slices shown]
[im 24/70  brain]
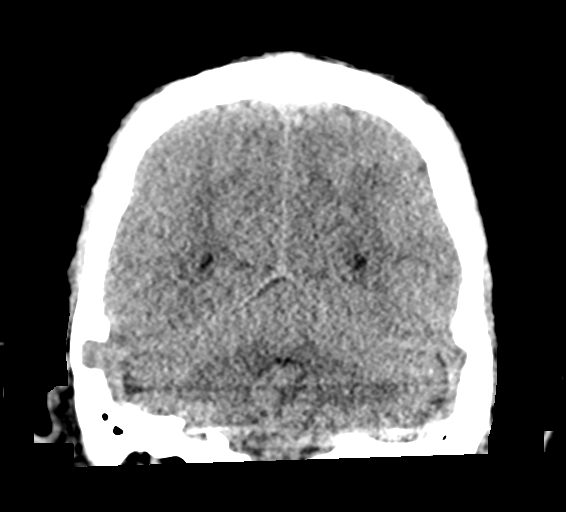
[im 31/70  brain]
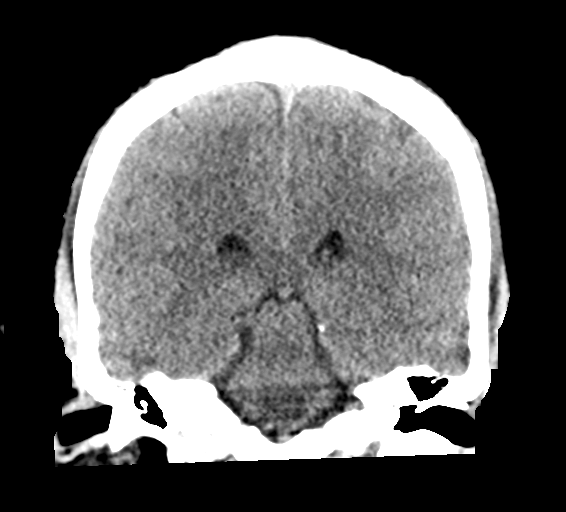
[im 39/70  brain]
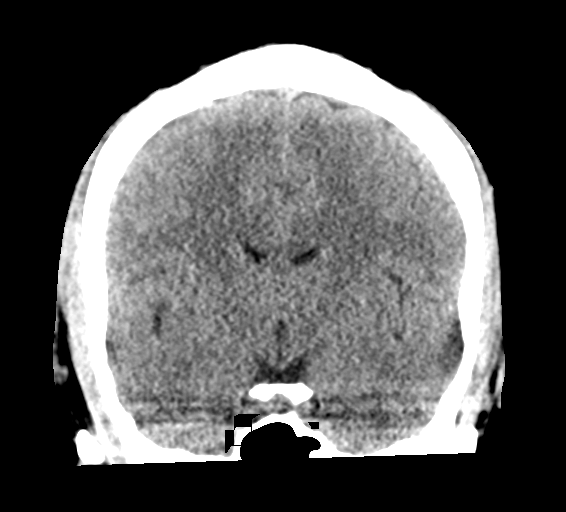

[Series 5: sagittal soft tissue · sagittal · 0.31mm/px · 3 of 51 slices shown]
[im 17/51  brain]
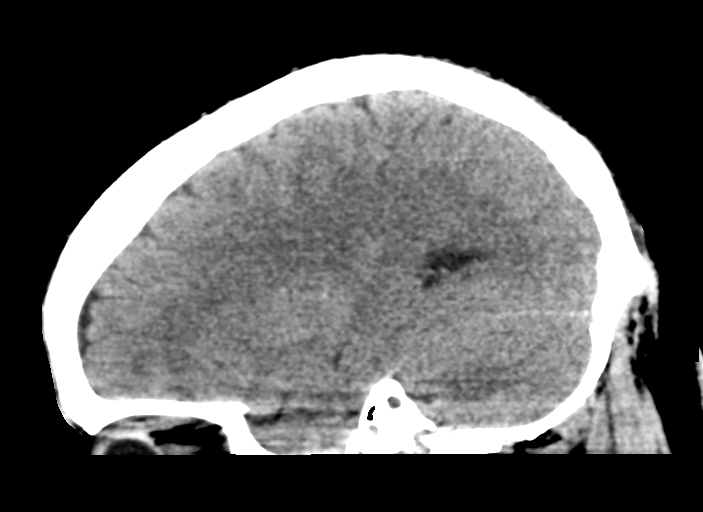
[im 26/51  brain]
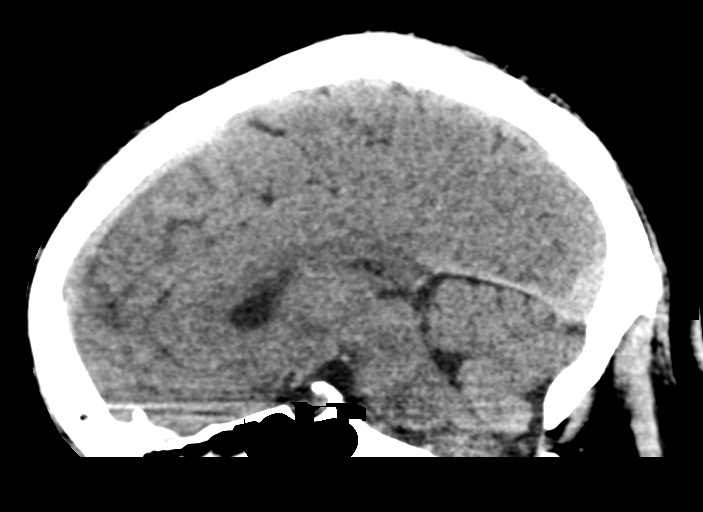
[im 34/51  brain]
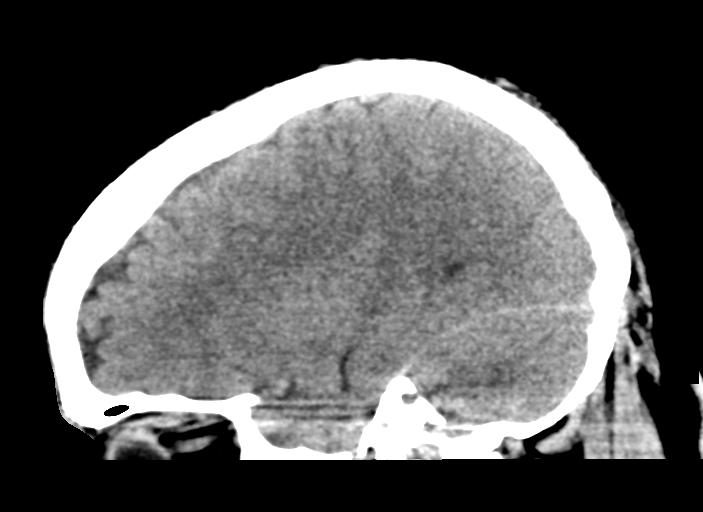

[15 of 46 positions shown; findings below may reference images not displayed]

FINDINGS: Brain: The ventricles and sulci appropriate size for patient's age.
The gray-white matter discrimination is preserved. There is no acute
intracranial hemorrhage. No mass effect or midline shift no
extra-axial fluid collection.

Vascular: No hyperdense vessel or unexpected calcification.

Skull: Normal. Negative for fracture or focal lesion.

Sinuses/Orbits: No acute finding.

Other: None
IMPRESSION: Unremarkable noncontrast CT of the brain.

## 2023-11-02 ENCOUNTER — Emergency Department (HOSPITAL_COMMUNITY)
Admission: EM | Admit: 2023-11-02 | Discharge: 2023-11-03 | Disposition: A | Discharge status: Home / Self Care | Payer: Self-pay | Attending: Emergency Medicine | Admitting: Emergency Medicine

## 2023-11-02 ENCOUNTER — Emergency Department (HOSPITAL_COMMUNITY): Payer: Self-pay

## 2023-11-02 DIAGNOSIS — R509 Fever, unspecified: Secondary | ICD-10-CM | POA: Insufficient documentation

## 2023-11-02 DIAGNOSIS — M25522 Pain in left elbow: Secondary | ICD-10-CM | POA: Insufficient documentation

## 2023-11-02 DIAGNOSIS — I10 Essential (primary) hypertension: Secondary | ICD-10-CM | POA: Insufficient documentation

## 2023-11-02 DIAGNOSIS — Z87891 Personal history of nicotine dependence: Secondary | ICD-10-CM | POA: Insufficient documentation

## 2023-11-02 LAB — COMPREHENSIVE METABOLIC PANEL WITH GFR
ALT: 14 U/L (ref 0–44)
AST: 16 U/L (ref 15–41)
Albumin: 4.2 g/dL (ref 3.5–5.0)
Alkaline Phosphatase: 52 U/L (ref 38–126)
Anion gap: 11 (ref 5–15)
BUN: 16 mg/dL (ref 6–20)
CO2: 27 mmol/L (ref 22–32)
Calcium: 9.1 mg/dL (ref 8.9–10.3)
Chloride: 99 mmol/L (ref 98–111)
Creatinine, Ser: 1.33 mg/dL — ABNORMAL HIGH (ref 0.61–1.24)
GFR, Estimated: >60 mL/min (ref >60)
Glucose, Bld: 95 mg/dL (ref 70–99)
Potassium: 4 mmol/L (ref 3.5–5.1)
Sodium: 137 mmol/L (ref 135–145)
Total Bilirubin: 0.7 mg/dL (ref 0.0–1.2)
Total Protein: 8.2 g/dL — ABNORMAL HIGH (ref 6.5–8.1)

## 2023-11-02 LAB — CBC WITH DIFFERENTIAL/PLATELET
Abs Immature Granulocytes: 0.02 K/uL (ref 0.00–0.07)
Basophils Absolute: 0 K/uL (ref 0.0–0.1)
Basophils Relative: 1 %
Eosinophils Absolute: 0.1 K/uL (ref 0.0–0.5)
Eosinophils Relative: 2 %
HCT: 43 % (ref 39.0–52.0)
Hemoglobin: 14.4 g/dL (ref 13.0–17.0)
Immature Granulocytes: 0 %
Lymphocytes Relative: 19 %
Lymphs Abs: 1.3 K/uL (ref 0.7–4.0)
MCH: 29.4 pg (ref 26.0–34.0)
MCHC: 33.5 g/dL (ref 30.0–36.0)
MCV: 87.8 fL (ref 80.0–100.0)
Monocytes Absolute: 0.7 K/uL (ref 0.1–1.0)
Monocytes Relative: 11 %
Neutro Abs: 4.5 K/uL (ref 1.7–7.7)
Neutrophils Relative %: 67 %
Platelets: 230 K/uL (ref 150–400)
RBC: 4.9 MIL/uL (ref 4.22–5.81)
RDW: 13.6 % (ref 11.5–15.5)
WBC: 6.7 K/uL (ref 4.0–10.5)
nRBC: 0 % (ref 0.0–0.2)

## 2023-11-02 LAB — URIC ACID: Uric Acid, Serum: 6.9 mg/dL (ref 3.7–8.6)

## 2023-11-02 LAB — I-STAT CG4 LACTIC ACID, ED: Lactic Acid, Venous: 1.7 mmol/L (ref 0.5–1.9)

## 2023-11-02 NOTE — ED Triage Notes (Signed)
 PT bib EMS with gout flare up in left elbow. Red and swollen. Pt has had allopurinol but does not have any colchicine .

## 2023-11-02 NOTE — ED Provider Triage Note (Signed)
 Emergency Medicine Provider Triage Evaluation Note  Robert Vincent , a 48 y.o. male  was evaluated in triage.  Pt complains of left elbow pain.  History of gout.  He has swelling that is new but states he has had it in the past.  Able to move the elbow.  He has had fever at home and pain is severe  Review of Systems  Positive: Left elbow pain Negative: Vomiting  Physical Exam  BP 139/83 (BP Location: Right Arm)   Pulse 91   Temp 98.6 F (37 C) (Oral)   Resp 15   SpO2 99%  Gen:   Awake, no distress   Resp:  Normal effort  MSK:   Moves extremities without difficulty  Other:  Swollen left olecranon bursa  Medical Decision Making  Medically screening exam initiated at 5:43 PM.  Appropriate orders placed.  Steward Sames was informed that the remainder of the evaluation will be completed by another provider, this initial triage assessment does not replace that evaluation, and the importance of remaining in the ED until their evaluation is complete.  Patient with questionable infected olecranon bursitis.  He is able to move the elbow joint.  I have ordered labs and imaging.   Arloa Chroman, PA-C 11/02/23 1745

## 2023-11-03 LAB — RESP PANEL BY RT-PCR (RSV, FLU A&B, COVID)  RVPGX2
Influenza A by PCR: NEGATIVE
Influenza B by PCR: NEGATIVE
Resp Syncytial Virus by PCR: NEGATIVE
SARS Coronavirus 2 by RT PCR: NEGATIVE

## 2023-11-03 MED ORDER — CEPHALEXIN 500 MG PO CAPS: 500.0000 mg | ORAL_CAPSULE | Freq: Three times a day (TID) | ORAL | 0 refills | Status: AC

## 2023-11-03 MED ORDER — OXYCODONE HCL 5 MG PO TABS: 5.0000 mg | ORAL_TABLET | ORAL | 0 refills | Status: DC | PRN

## 2023-11-03 MED ORDER — SULFAMETHOXAZOLE-TRIMETHOPRIM 800-160 MG PO TABS: 1.0000 | ORAL_TABLET | Freq: Two times a day (BID) | ORAL | 0 refills | Status: AC

## 2023-11-03 MED ORDER — HYDROMORPHONE HCL 1 MG/ML IJ SOLN
1.0000 mg | Freq: Once | INTRAMUSCULAR | Status: AC
  Administered 2023-11-03: 1 mg via INTRAMUSCULAR
  Filled 2023-11-03: qty 1

## 2023-11-03 MED ORDER — OXYCODONE HCL 5 MG PO TABS: 5.0000 mg | ORAL_TABLET | ORAL | 0 refills | Status: AC | PRN

## 2023-11-03 MED ORDER — SULFAMETHOXAZOLE-TRIMETHOPRIM 800-160 MG PO TABS: 1.0000 | ORAL_TABLET | Freq: Two times a day (BID) | ORAL | 0 refills | Status: DC

## 2023-11-03 MED ORDER — CEPHALEXIN 500 MG PO CAPS: 500.0000 mg | ORAL_CAPSULE | Freq: Three times a day (TID) | ORAL | 0 refills | Status: DC

## 2023-11-03 NOTE — ED Provider Notes (Signed)
 [facility] Emergency Department Provider Note MRN:  [id number]  Arrival date & time: 11/03/23     Chief Complaint   Joint Swelling   History of Present Illness   Robert Vincent is a 48 y.o. year-old male with a history of gout presenting to the ED with chief complaint of joint swelling.  Worsening pain to the left elbow over the past 2 days.  Also had a fever that started yesterday.  Lives in a halfway house currently and has multiple housemates that also have fever.  Review of Systems  A thorough review of systems was obtained and all systems are negative except as noted in the HPI and PMH.   Patient's Health History    Past Medical History:  Diagnosis Date   Hypertension     No past surgical history on file.  No family history on file.  Social History   Socioeconomic History   Marital status: Single    Spouse name: Not on file   Number of children: Not on file   Years of education: Not on file   Highest education level: Not on file  Occupational History   Not on file  Tobacco Use   Smoking status: Former   Smokeless tobacco: Never  Substance and Sexual Activity   Alcohol use: Yes   Drug use: Not Currently   Sexual activity: Not on file  Other Topics Concern   Not on file  Social History Narrative   Not on file   Social Drivers of Health   Financial Resource Strain: Not on file  Food Insecurity: Not on file  Transportation Needs: Not on file  Physical Activity: Not on file  Stress: Not on file  Social Connections: Not on file  Intimate Partner Violence: Not on file     Physical Exam   Vitals:   11/03/23 0008 11/03/23 0100  BP:  119/61  Pulse:  (!) 102  Resp:  18  Temp: (!) 103 F (39.4 C)   SpO2:  100%    CONSTITUTIONAL: Well-appearing, NAD NEURO/PSYCH:  Alert and oriented x 3, no focal deficits EYES:  eyes equal and reactive ENT/NECK:  no LAD, no JVD CARDIO: Regular rate, well-perfused, normal S1 and S2 PULM:  CTAB  no wheezing or rhonchi GI/GU:  non-distended, non-tender MSK/SPINE:  No gross deformities, no edema SKIN:  no rash, atraumatic   *Additional and/or pertinent findings included in MDM below  Diagnostic and Interventional Summary    EKG Interpretation Date/Time:    Ventricular Rate:    PR Interval:    QRS Duration:    QT Interval:    QTC Calculation:   R Axis:      Text Interpretation:         Labs Reviewed  COMPREHENSIVE METABOLIC PANEL WITH GFR - Abnormal; Notable for the following components:      Result Value   Creatinine, Ser 1.33 (*)    Total Protein 8.2 (*)    All other components within normal limits  RESP PANEL BY RT-PCR (RSV, FLU A&B, COVID)  [id number]  CULTURE, BLOOD (ROUTINE X 2)  CULTURE, BLOOD (ROUTINE X 2)  CBC WITH DIFFERENTIAL/PLATELET  URIC ACID  I-STAT CG4 LACTIC ACID, ED    DG Elbow Complete Left  Final Result      Medications  HYDROmorphone  (DILAUDID ) injection 1 mg (1 mg Intramuscular Given 11/03/23 0122)     Procedures  /  Critical Care .Ultrasound ED Soft Tissue  Date/Time: 11/03/2023 2:27 AM  Performed by: Theadore Ozell HERO, MD Authorized by: Theadore Ozell HERO, MD   Procedure details:    Indications: evaluate for cellulitis     Transverse view:  Visualized   Longitudinal view:  Visualized   Images: archived   Location:    Location comment:  Left elbow Findings:     cellulitis present Aspiration of blood/fluid  Date/Time: 11/03/2023 2:27 AM  Performed by: Theadore Ozell HERO, MD Authorized by: Theadore Ozell HERO, MD  Consent: Verbal consent obtained. Written consent obtained Risks and benefits: risks, benefits and alternatives were discussed Consent given by: patient Patient understanding: patient states understanding of the procedure being performed Imaging studies: imaging studies available Patient identity confirmed: verbally with patient Time out: Immediately prior to procedure a time out was called to verify the correct patient,  procedure, equipment, support staff and site/side marked as required. Preparation: Patient was prepped and draped in the usual sterile fashion. Local anesthesia used: no  Anesthesia: Local anesthesia used: no  Sedation: Patient sedated: no  Patient tolerance: patient tolerated the procedure well with no immediate complications Comments: 2 attempts at aspiration of fluid from the inflamed bursa of the left elbow, insufficient quantity obtained for further testing     ED Course and Medical Decision Making  Initial Impression and Ddx Patient with a fever up to 103 however nontoxic, well-appearing.  Has olecranon bursitis on the left, has preserved range of motion of the left elbow and so doubt septic joint.  Could be septic bursitis, the area is tender and warm.  Fever could be coming from viral illness given the sick contacts.  Past medical/surgical history that increases complexity of ED encounter: Gout  Interpretation of Diagnostics I personally reviewed the Laboratory Testing and my interpretation is as follows: No significant blood count or electrolyte disturbance.  Normal lactic acid, no leukocytosis  X-ray largely unremarkable  Patient Reassessment and Ultimate Disposition/Management     I performed bedside ultrasound and noted cobblestoning to the soft tissues of the swollen bursa.  Attempted aspiration of fluid, insufficient amount obtained.  I favor that the fever is related to viral illness given the sick contacts rather than septic bursitis however will empirically cover with antibiotics, strict return precautions.  Patient management required discussion with the following services or consulting groups:  None  Complexity of Problems Addressed Acute illness or injury that poses threat of life of bodily function  Additional Data Reviewed and Analyzed Further history obtained from: Prior labs/imaging results  Additional Factors Impacting ED Encounter Risk Use of  parenteral controlled substances and Consideration of hospitalization  [name]. Theadore, MD [facility] Emergency Medicine [facility] [email or web address]  Final Clinical Impressions(s) / ED Diagnoses     ICD-10-CM   1. Left elbow pain  M25.522     2. Fever, unspecified fever cause  R50.9       ED Discharge Orders          Ordered    cephALEXin  (KEFLEX ) 500 MG capsule  3 times daily,   Status:  Discontinued        11/03/23 0220    sulfamethoxazole -trimethoprim  (BACTRIM  DS) 800-160 MG tablet  2 times daily,   Status:  Discontinued        11/03/23 0220    oxyCODONE  (ROXICODONE ) 5 MG immediate release tablet  Every 4 hours PRN,   Status:  Discontinued        11/03/23 0221    cephALEXin  (KEFLEX ) 500 MG capsule  3 times daily  11/03/23 0230    sulfamethoxazole -trimethoprim  (BACTRIM  DS) 800-160 MG tablet  2 times daily        11/03/23 0230    oxyCODONE  (ROXICODONE ) 5 MG immediate release tablet  Every 4 hours PRN        11/03/23 0230             Discharge Instructions Discussed with and Provided to Patient:    Discharge Instructions      You were evaluated in the Emergency Department and after careful evaluation, we did not find any emergent condition requiring admission or further testing in the hospital.  Your exam/testing today is overall reassuring.  Symptoms may be related to an infectious bursitis.  Important that you take the Keflex  and Bactrim  antibiotics as directed.  Recommend Tylenol  1000 mg every 4-6 hours and/or Motrin 600 mg every 4-6 hours for pain.  You can use the oxycodone  medication for more significant pain.  Please return to the Emergency Department if you experience any worsening of your condition.   Thank you for allowing us  to be a part of your care.      Theadore Ozell HERO, MD 11/03/23 [phone]

## 2023-11-03 NOTE — Discharge Instructions (Signed)
 You were evaluated in the Emergency Department and after careful evaluation, we did not find any emergent condition requiring admission or further testing in the hospital.  Your exam/testing today is overall reassuring.  Symptoms may be related to an infectious bursitis.  Important that you take the Keflex  and Bactrim  antibiotics as directed.  Recommend Tylenol  1000 mg every 4-6 hours and/or Motrin 600 mg every 4-6 hours for pain.  You can use the oxycodone  medication for more significant pain.  Please return to the Emergency Department if you experience any worsening of your condition.   Thank you for allowing us  to be a part of your care.

## 2023-11-07 LAB — CULTURE, BLOOD (ROUTINE X 2): Culture: NO GROWTH

## 2023-11-08 LAB — CULTURE, BLOOD (ROUTINE X 2)
Culture: NO GROWTH
Special Requests: ADEQUATE
